# Patient Record
Sex: Female | Born: 1960 | Race: White | Hispanic: No | Marital: Single
Health system: Southern US, Community
[De-identification: ages and names within clinical notes are randomized; demographics above are authoritative.]

---

## 2012-04-05 ENCOUNTER — Emergency Department: Payer: Self-pay | Admitting: Emergency Medicine

## 2012-04-05 LAB — COMPREHENSIVE METABOLIC PANEL
Alkaline Phosphatase: 76 U/L (ref 50–136)
Anion Gap: 9 (ref 7–16)
BUN: 13 mg/dL (ref 7–18)
Bilirubin,Total: 0.3 mg/dL (ref 0.2–1.0)
Chloride: 107 mmol/L (ref 98–107)
Co2: 25 mmol/L (ref 21–32)
Creatinine: 0.74 mg/dL (ref 0.60–1.30)
EGFR (African American): >60
EGFR (Non-African Amer.): >60
Osmolality: 281 (ref 275–301)
Potassium: 3.8 mmol/L (ref 3.5–5.1)
SGOT(AST): 25 U/L (ref 15–37)
SGPT (ALT): 21 U/L
Total Protein: 7.1 g/dL (ref 6.4–8.2)

## 2012-04-05 LAB — DIFFERENTIAL
Basophil #: 0 10*3/uL (ref 0.0–0.1)
Basophil %: 0.4 %
Eosinophil #: 0.2 10*3/uL (ref 0.0–0.7)
Eosinophil %: 2.4 %
Lymphocyte #: 2 10*3/uL (ref 1.0–3.6)
Lymphocyte %: 28.8 %
Monocyte #: 0.6 x10 3/mm (ref 0.2–0.9)
Monocyte %: 8.1 %

## 2012-04-05 LAB — CBC
HGB: 13.3 g/dL (ref 12.0–16.0)
MCH: 30.6 pg (ref 26.0–34.0)
MCHC: 33.3 g/dL (ref 32.0–36.0)
Platelet: 291 10*3/uL (ref 150–440)

## 2012-04-05 LAB — TROPONIN I: Troponin-I: <0.02 ng/mL

## 2013-08-07 IMAGING — CR DG CHEST 1V PORT
1 series · 2 of 2 positions shown · non-contrast
Comparison: none

REASON FOR EXAM: chest pain  - rm1
COMMENTS:

[Series 1: ap · 0.17mm/px · 2 of 2 slices shown]
[im 1/2]
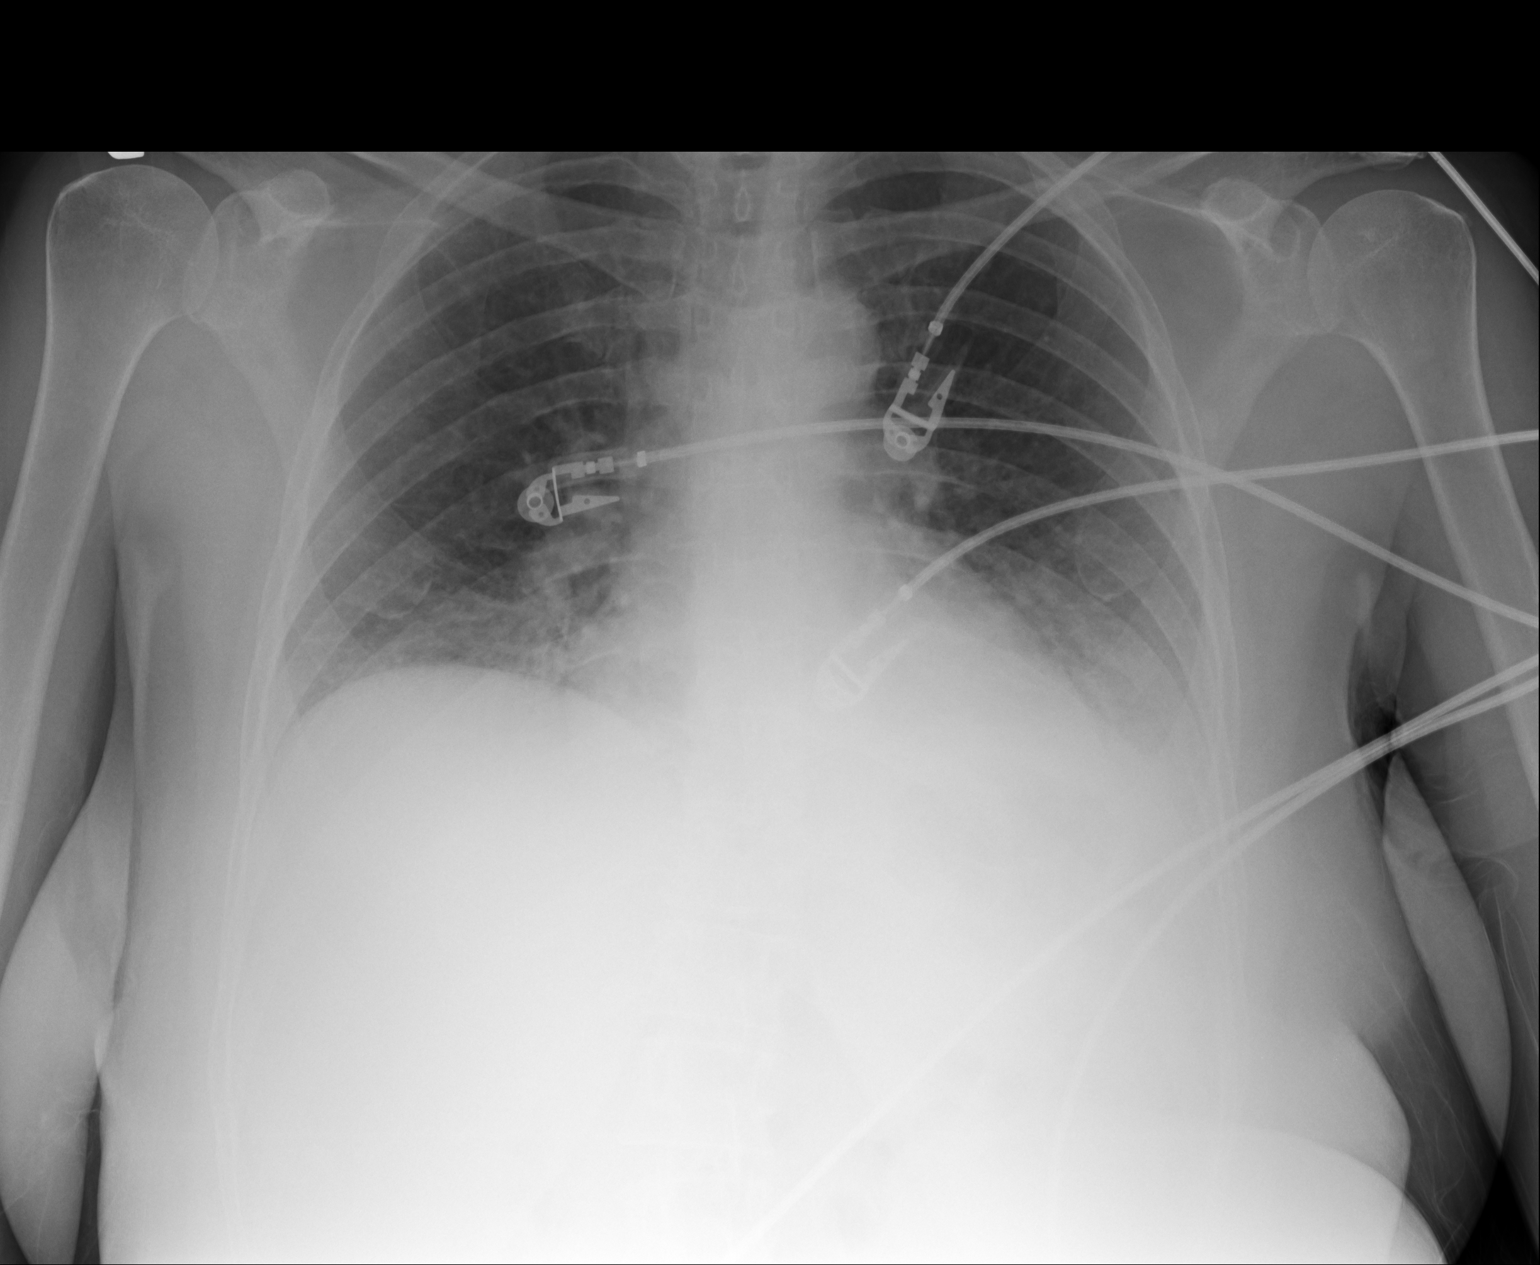
[im 2/2]
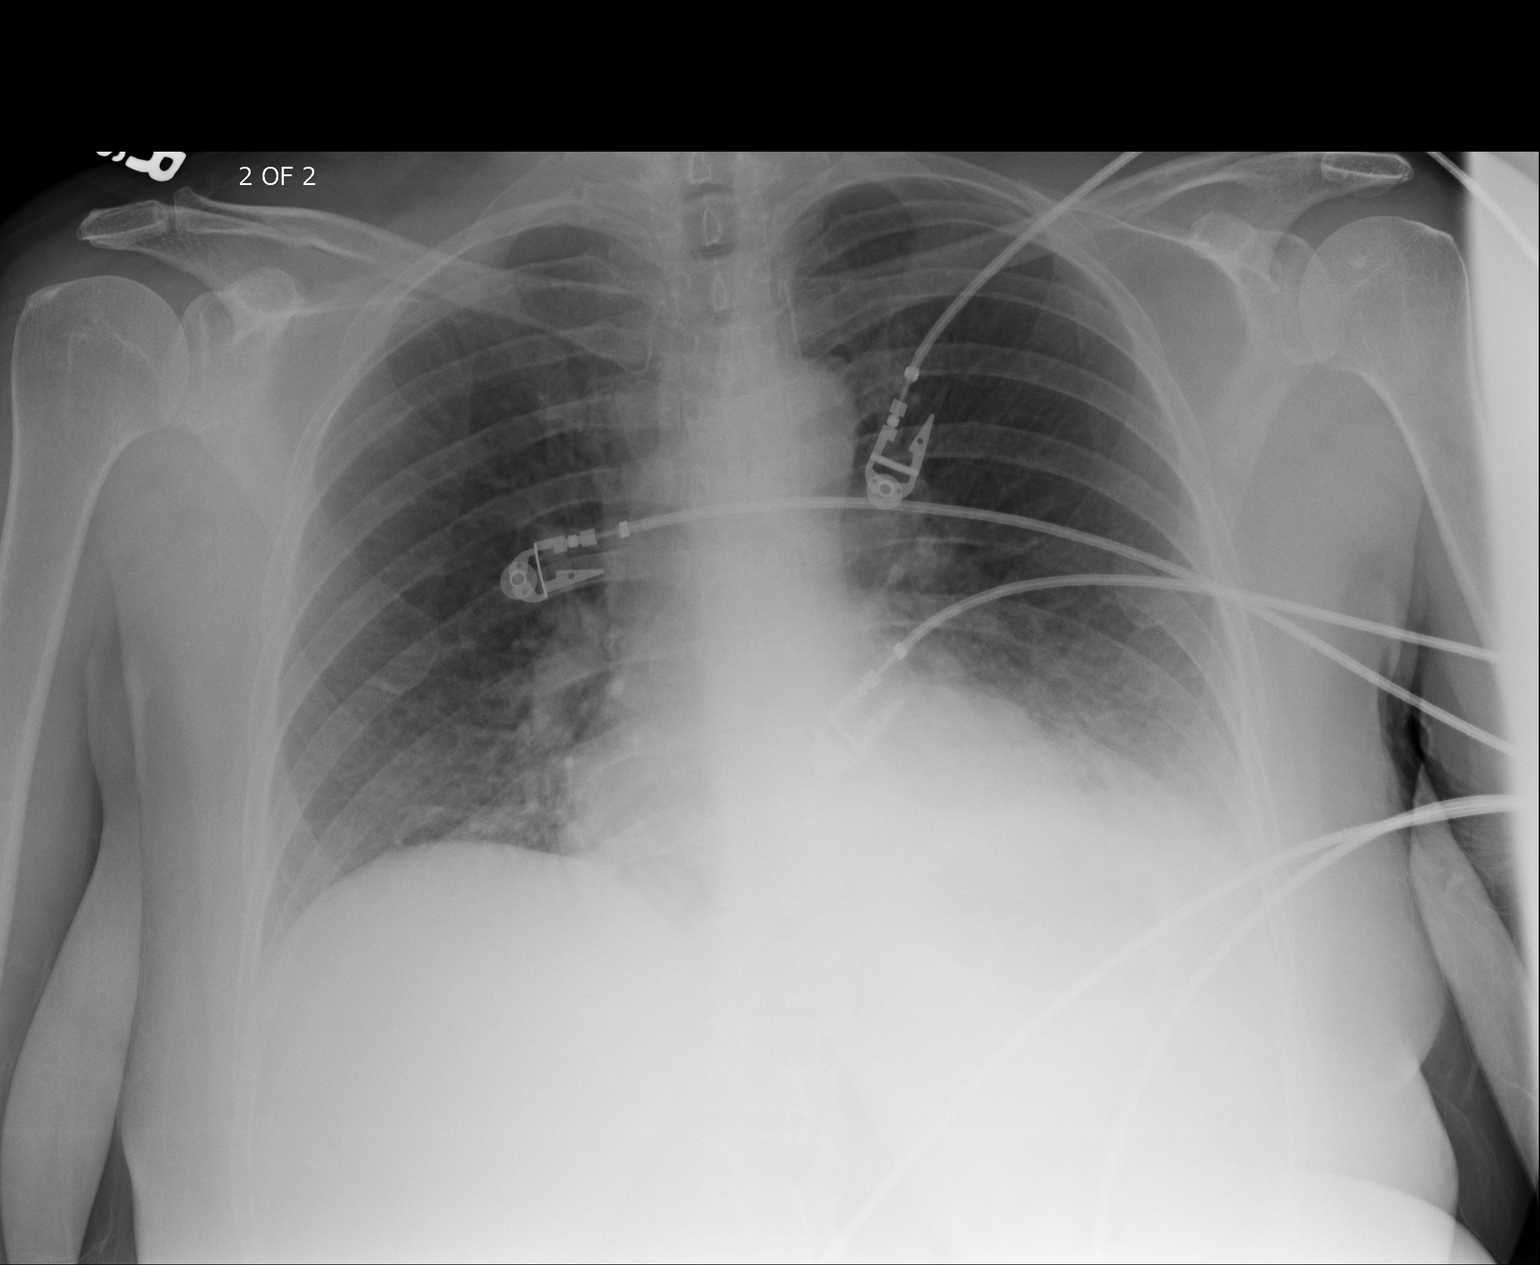

[2 of 2 positions shown; findings below may reference images not displayed]

PROCEDURE:     DXR - DXR PORTABLE CHEST SINGLE VIEW  - April 05, 2012  [DATE]

RESULT:

The patient has taken a shallow inspiration. There is thickening of the
interstitial markings and areas of increased density within the lung bases.
There is blunting of the left costophrenic angle. The cardiac silhouette is
moderately enlarged. The visualized bony skeleton is unremarkable.
IMPRESSION: Interstitial infiltrate likely representing a component of pulmonary edema.
Small left effusion is also of diagnostic consideration. Surveillance
evaluation recommended.

## 2015-04-10 NOTE — Consult Note (Signed)
PATIENT NAME:  Sandra, Clark MR#:  [id number] DATE OF BIRTH:  March 06, 1961  DATE OF CONSULTATION:  04/05/2012  REFERRING PHYSICIAN:   CONSULTING PHYSICIAN:  Sandra Caper, MD  PRIMARY CARE PHYSICIAN: None   HISTORY OF PRESENT ILLNESS:  The patient is a 54 year old Caucasian female with no significant past medical history who presented to the hospital with complaints of chest pains. According to the patient, she has been having chest pain for a couple of days now. It is described as midsternal to the left beneath the breast, radiating around the breast area. The pain is sharp in intensity, increasing with movement as well as taking deep breaths. The patient denies any fevers or chills. Denies any significant cough, however admits of exposure to recent cough during her travel.  She traveled down to [place] to visit [facility] just recently. She admits of having some shortness of breath with this pain in the chest as well as increasing respiratory rate; however, she states that she is splinting, cannot take a deep breath, and that is why she is short of breath. She has never had similar pains before, and the pain seemed to be worsening over the past few days.   PAST MEDICAL HISTORY: Hypertension, history of abdominal pains of unclear etiology, likely irritable bowel syndrome.  MEDICATIONS: No medications.  PAST SURGICAL HISTORY:  No prior surgeries.   ALLERGIES: No known drug allergies.    FAMILY HISTORY: Significant for history of diabetes mellitus in multiple family members, hypertension, hyperlipidemia, lung cancer in smokers, pancreatic cancer, and intestinal cancer.   SOCIAL HISTORY: The patient is divorced, has one child. She has been visiting [place] with this child who is finishing high school and wants to enroll at [employer].  REVIEW OF SYSTEMS:  Positive for some blurring of vision recently, especially in the evenings and nighttime driving, also chest pain in the left side of the  chest, some hot flashes and fluttering in the chest whenever she has hot flashes intermittently. She denies fevers, chills, fatigue, weakness, pains except as mentioned above. No weight loss or gain. EYES: In regards to eyes, denies any blurry vision, double vision, glaucoma, or cataracts. ENT: Denies any tinnitus, allergies, epistaxis, sinus pain, dentures, or difficulty swallowing. RESPIRATORY:  Denies any  cough, wheezing, asthma, or chronic obstructive pulmonary disease. Admits having some shortness of breath especially with splinting. CARDIOVASCULAR: Denies any orthopnea, edema, arrhythmias, palpitations, or syncope. GASTROINTESTINAL: Denies nausea, vomiting, diarrhea, or constipation.  GU: Denies dysuria, hematuria, frequency, or incontinence. ENDOCRINE: Denies any polydipsia, nocturia, thyroid problems, heat or cold intolerance, or thirst. HEMATOLOGIC: Denies anemia, easy bruising or bleeding, or swollen glands. SKIN: Denies any acne, rashes, lesions, or change in moles. MUSCULOSKELETAL: Denies arthritis, cramps, swelling, or gout. NEUROLOGIC: No numbness, epilepsy, or tremor. PSYCH: Denies anxiety, insomnia, or depression.  PHYSICAL EXAMINATION: VITAL SIGNS:  On arrival to the hospital, temperature is 97.6, pulse 84, respiration rate 20, blood pressure 142/69, saturation 96% on room air.   GENERAL: This is a well-developed, well-nourished Caucasian female in no significant distress, moderately uncomfortable when she moves around or she tries to take a deep breath.  HEENT:  Pupils equal, round, reactive to light. Extraocular movements intact. No icterus or conjunctivitis.  Has normal hearing. No pharyngeal erythema. Mucosa is moist.   NECK: Neck did not reveal any masses, supple, nontender. Thyroid not enlarged. No adenopathy. No JVD or carotid bruits bilaterally. Full range of motion.   LUNGS: Clear to auscultation.  Slightly diminished breath sounds  on the left. A few rhonchi were heard on the  left. No wheezing. No labored inspirations. However, the patient does have increased effort as well as splinting and increased respiratory rate. No dullness to percussion. Not in overt respiratory distress.   CARDIOVASCULAR: S1 and S2 appreciated. No murmurs, rubs, or gallops noted.  PMI not lateralized. Chest is tender to palpation in the midsternal area as well as on the left side of the chest, left thoracic area, left posterior chest.  2+ pedal pulses. No lower extremity edema, calf tenderness, or cyanosis noted.   ABDOMEN: Soft, nontender. Bowel sounds present. No hepatosplenomegaly or masses were noted.   RECTAL: Deferred.   MUSCULOSKELETAL: Muscle strength- able to move all extremities. No cyanosis, degenerative joint disease, or kyphosis. Gait not tested.   SKIN: Skin did not reveal any rashes, lesions, erythema, nodularity, or induration. It was warm and dry to palpation.   LYMPH: No adenopathy in the cervical region.   NEUROLOGIC: Cranial nerves grossly intact.  Sensation intact. No dysarthria or aphasia. The patient is alert, oriented to time, person, and place, cooperative. Memory is good.   PSYCHIATRIC: No significant confusion, agitation, or depression.   LABORATORY DATA: BMP within normal limits. Liver enzymes normal. Cardiac enzymes, first set, negative. The second set is pending. CBC within normal limits. D-dimer is slightly up at 0.57.   EKG normal sinus rhythm at 72 beats per minute,  normal axis, no acute ST-T changes were noted.   RADIOLOGIC STUDIES: Chest x-ray, portable single view 04/05/2012 showed interstitial infiltrate likely representing component of pulmonary edema according to the radiologist. A small left effusion also a diagnostic consideration. Surveillance evaluation is recommended. CT scan of the chest with IV contrast to rule out pulmonary embolism 04/05/2012 showed no CT evidence of pulmonary arterial embolic disease. Findings consistent with competent of  interstitial infiltrate, questionable pulmonary edema. Pulmonary nodule in the periphery of the right lower lobe was noted.  Trace pleural effusion on the left as well as right thyroid lobe low attenuating mass approximately 1.87 cm in diameter.   ASSESSMENT AND PLAN:  1. Chest pain, pleuritic: Unclear etiology, questionably related to recent viral infection versus bacterial infection, cannot rule out bacterial or atypical pneumonia as well as some pleural effusion related to that pneumonia. We will start the patient on Levaquin orally. We will add Advil high doses versus Percocet. The patient will need to establish with a primary care physician and be seen in [place] in the next few days after discharge.  2. Right lower lobe nodule: Apparently the patient  was followed in the past.  The patient needs to continue to follow up with her PCP in the next few days and make decisions about following up the nodule depending on her risk factors.  3. Thyroid nodule: We will get TSH.  The patient will need to have work-up as an outpatient.   TIME SPENT: 50 minutes.    ____________________________ Sandra Caperima Kashay Cavenaugh, MD rv:[id number] D: 04/05/2012 10:46:15 ET T: 04/05/2012 12:00:11 ET JOB#: [id number]  cc: Sandra Caperima Sandra Marschall, MD, <Dictator> Laiya Wisby MD ELECTRONICALLY SIGNED 04/26/2012 14:33
